# Patient Record
Sex: Female | Born: 2005 | Race: Black or African American | Hispanic: No | Marital: Single | Smoking: Never smoker
Health system: Southern US, Community
[De-identification: ages and names within clinical notes are randomized; demographics above are authoritative.]

## PROBLEM LIST (undated history)

## (undated) DIAGNOSIS — N390 Urinary tract infection, site not specified: Secondary | ICD-10-CM

## (undated) DIAGNOSIS — R569 Unspecified convulsions: Secondary | ICD-10-CM

---
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
## 2024-02-02 ENCOUNTER — Other Ambulatory Visit: Payer: Self-pay | Admitting: Neurology

## 2024-02-02 MED ORDER — LACOSAMIDE 200 MG PO TABS: 200.0000 mg | ORAL_TABLET | Freq: Two times a day (BID) | ORAL | 0 refills | Status: AC
